# Patient Record
Sex: Female | Born: 2000 | Race: White | Hispanic: No | Marital: Single | Smoking: Never smoker
Health system: Southern US, Community
[De-identification: ages and names within clinical notes are randomized; demographics above are authoritative.]

## PROBLEM LIST (undated history)

## (undated) DIAGNOSIS — E282 Polycystic ovarian syndrome: Secondary | ICD-10-CM

## (undated) HISTORY — PX: TYMPANOSTOMY TUBE PLACEMENT: SHX32

## (undated) HISTORY — PX: TONSILLECTOMY AND ADENOIDECTOMY: SUR1326

## (undated) HISTORY — DX: Polycystic ovarian syndrome: E28.2

---
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
## 2021-01-16 ENCOUNTER — Other Ambulatory Visit: Payer: Self-pay

## 2021-01-16 ENCOUNTER — Emergency Department
Admission: EM | Admit: 2021-01-16 | Discharge: 2021-01-17 | Disposition: A | Discharge status: Home / Self Care | Payer: Medicaid Other | Attending: Emergency Medicine | Admitting: Emergency Medicine

## 2021-01-16 ENCOUNTER — Encounter: Payer: Self-pay | Admitting: Intensive Care

## 2021-01-16 DIAGNOSIS — R1030 Lower abdominal pain, unspecified: Secondary | ICD-10-CM | POA: Insufficient documentation

## 2021-01-16 DIAGNOSIS — R102 Pelvic and perineal pain: Secondary | ICD-10-CM

## 2021-01-16 LAB — COMPREHENSIVE METABOLIC PANEL
ALT: 19 U/L (ref 0–44)
AST: 18 U/L (ref 15–41)
Albumin: 4.2 g/dL (ref 3.5–5.0)
Alkaline Phosphatase: 61 U/L (ref 38–126)
Anion gap: 5 (ref 5–15)
BUN: <5 mg/dL — ABNORMAL LOW (ref 6–20)
CO2: 25 mmol/L (ref 22–32)
Calcium: 9.2 mg/dL (ref 8.9–10.3)
Chloride: 108 mmol/L (ref 98–111)
Creatinine, Ser: 0.6 mg/dL (ref 0.44–1.00)
GFR, Estimated: >60 mL/min (ref >60)
Glucose, Bld: 115 mg/dL — ABNORMAL HIGH (ref 70–99)
Potassium: 4.2 mmol/L (ref 3.5–5.1)
Sodium: 138 mmol/L (ref 135–145)
Total Bilirubin: 0.7 mg/dL (ref 0.3–1.2)
Total Protein: 7 g/dL (ref 6.5–8.1)

## 2021-01-16 LAB — CBC
HCT: 40.1 % (ref 36.0–46.0)
Hemoglobin: 13.1 g/dL (ref 12.0–15.0)
MCH: 30.5 pg (ref 26.0–34.0)
MCHC: 32.7 g/dL (ref 30.0–36.0)
MCV: 93.3 fL (ref 80.0–100.0)
Platelets: 221 10*3/uL (ref 150–400)
RBC: 4.3 MIL/uL (ref 3.87–5.11)
RDW: 13.1 % (ref 11.5–15.5)
WBC: 10.4 10*3/uL (ref 4.0–10.5)
nRBC: 0 % (ref 0.0–0.2)

## 2021-01-16 LAB — LIPASE, BLOOD: Lipase: 34 U/L (ref 11–51)

## 2021-01-16 LAB — URINALYSIS, COMPLETE (UACMP) WITH MICROSCOPIC
Bilirubin Urine: NEGATIVE
Glucose, UA: NEGATIVE mg/dL
Hgb urine dipstick: NEGATIVE
Ketones, ur: NEGATIVE mg/dL
Leukocytes,Ua: NEGATIVE
Nitrite: NEGATIVE
Protein, ur: NEGATIVE mg/dL
Specific Gravity, Urine: 1.017 (ref 1.005–1.030)
pH: 8 (ref 5.0–8.0)

## 2021-01-16 LAB — POC URINE PREG, ED: Preg Test, Ur: NEGATIVE

## 2021-01-16 MED ORDER — DROPERIDOL 2.5 MG/ML IJ SOLN
2.5000 mg | Freq: Once | INTRAMUSCULAR | Status: AC
  Administered 2021-01-16: 2.5 mg via INTRAMUSCULAR
  Filled 2021-01-16: qty 2

## 2021-01-16 NOTE — ED Provider Notes (Signed)
[facility] Emergency Department Provider Note ____________________________________________   Event Date/Time   First MD Initiated Contact with Patient 01/16/21 2256     (approximate)  I have reviewed the triage vital signs and the nursing notes.  HISTORY  Chief Complaint Abdominal Pain   HPI Michele Holmes is a 20 y.o. femalewho presents to the ED for evaluation of abd pain.   Chart review indicates hx obesity, PCOS.   Patient presents to the ED for evaluation of 5-6 days of lower abdominal/pelvic pain.  She reports the pain is central to her pelvis/suprapubic area, radiating bilaterally outwards.    She denies any additional symptoms alongside the pain, such as nausea, emesis, stool changes, vaginal discharge or bleeding, dysuria or hematuria.  She reports her LMP was about 1 month ago and is due for this week, she reports 1 day of typical vaginal bleeding 3 days ago that self resolved and has not continued.  She reports going to urgent care due to 3 days ago and was told that she probably has irritable bowel syndrome.   She reports that she is tolerating p.o. intake and toileting at her baseline.   Past Medical History:  Diagnosis Date   PCOS (polycystic ovarian syndrome)     Patient Active Problem List   Diagnosis Date Noted   PCOS (polycystic ovarian syndrome) 03/29/2018    Past Surgical History:  Procedure Laterality Date   TONSILLECTOMY AND ADENOIDECTOMY     TYMPANOSTOMY TUBE PLACEMENT      Prior to Admission medications   Medication Sig Start Date End Date Taking? Authorizing Provider  metFORMIN (GLUCOPHAGE) 1000 MG tablet TAKE 1 1/2 TAB BY MOUTH ONCE A DAY FOR 30 DAYS 02/15/18   [provider]    Allergies Patient has no known allergies.  History reviewed. No pertinent family history.  Social History Social History   Tobacco Use   Smoking status: Never   Smokeless tobacco: Never  Substance Use Topics   Alcohol  use: Not Currently   Drug use: Yes    Types: Marijuana   Review of Systems  Constitutional: No fever/chills Eyes: No visual changes. ENT: No sore throat. Cardiovascular: Denies chest pain. Respiratory: Denies shortness of breath. Gastrointestinal: Positive for abdominal/pelvic pain  No nausea, no vomiting.  No diarrhea.  No constipation. Genitourinary: Negative for dysuria. Musculoskeletal: Negative for back pain. Skin: Negative for rash. Neurological: Negative for headaches, focal weakness or numbness. ____________________________________________   PHYSICAL EXAM:  VITAL SIGNS: Vitals:   01/16/21 2330 01/17/21 0000  BP: 107/72 102/85  Pulse: 82 74  Resp:    Temp:    SpO2: 100% 100%    Constitutional: Alert and oriented. Well appearing and in no acute distress.  Sitting up in bed, playing on her phone. Eyes: Conjunctivae are normal. PERRL. EOMI. Head: Atraumatic. Nose: No congestion/rhinnorhea. Mouth/Throat: Mucous membranes are moist.  Oropharynx non-erythematous. Neck: No stridor. No cervical spine tenderness to palpation. Cardiovascular: Normal rate, regular rhythm. Grossly normal heart sounds.  Good peripheral circulation. Respiratory: Normal respiratory effort.  No retractions. Lungs CTAB. Gastrointestinal: Soft , nondistended. No CVA tenderness. Diffuse lower quadrant tenderness to palpation without localizing or peritoneal features.  Upper abdomen is benign. Musculoskeletal: No lower extremity tenderness nor edema.  No joint effusions. No signs of acute trauma. Neurologic:  Normal speech and language. No gross focal neurologic deficits are appreciated. No gait instability noted. Skin:  Skin is warm, dry and intact. No rash noted. Psychiatric: Mood and affect are  normal. Speech and behavior are normal.  ____________________________________________   LABS (all labs ordered are listed, but only abnormal results are displayed)  Labs Reviewed  COMPREHENSIVE  METABOLIC PANEL - Abnormal; Notable for the following components:      Result Value   Glucose, Bld 115 (*)    BUN <5 (*)    All other components within normal limits  URINALYSIS, COMPLETE (UACMP) WITH MICROSCOPIC - Abnormal; Notable for the following components:   Color, Urine YELLOW (*)    APPearance HAZY (*)    Bacteria, UA RARE (*)    All other components within normal limits  LIPASE, BLOOD  CBC  POC URINE PREG, ED   ____________________________________________  12 Lead EKG   ____________________________________________  RADIOLOGY  ED MD interpretation:    Official radiology report(s): US PELVIC COMPLETE W TRANSVAGINAL AND TORSION R/O  Result Date: 01/17/2021 CLINICAL DATA:  Pelvic pain, left greater than right, x5 days. PCOS. EXAM: TRANSABDOMINAL AND TRANSVAGINAL ULTRASOUND OF PELVIS DOPPLER ULTRASOUND OF OVARIES TECHNIQUE: Both transabdominal and transvaginal ultrasound examinations of the pelvis were performed. Transabdominal technique was performed for global imaging of the pelvis including uterus, ovaries, adnexal regions, and pelvic cul-de-sac. It was necessary to proceed with endovaginal exam following the transabdominal exam to visualize the endometrium. Color and duplex Doppler ultrasound was utilized to evaluate blood flow to the ovaries. COMPARISON:  None. FINDINGS: Uterus Measurements: 8.2 x 3.1 x 4.3 cm = volume: 56 mL. No fibroids or other mass visualized. Endometrium Thickness: 10 mm.  No focal abnormality visualized. Right ovary Measurements: 3.4 x 2.5 x 3.3 cm = volume: 14.5 mL. Multiple small follicles, including a dominant 1.9 cm follicle/corpus luteum. Left ovary Measurements: 3.4 x 1.9 x 2.2 cm = volume: 7.4 mL. Multiple small follicles. Pulsed Doppler evaluation of both ovaries demonstrates normal low-resistance arterial and venous waveforms. Other findings No abnormal free fluid. IMPRESSION: No evidence of ovarian torsion. Multiple small ovarian follicles,  compatible with the known clinical diagnosis of PCOS. Electronically Signed   By: Charline Bills M.D.   On: 01/17/2021 01:00    ____________________________________________   PROCEDURES and INTERVENTIONS  Procedure(s) performed (including Critical Care):  Procedures  Medications  diphenhydrAMINE (BENADRYL) capsule 25 mg (has no administration in time range)  droperidol (INAPSINE) 2.5 MG/ML injection 2.5 mg (2.5 mg Intramuscular Given 01/16/21 2352)    ____________________________________________   MDM / ED COURSE   Obese 20 year old woman without significant medical history presents to the ED with lower pelvic pain of uncertain etiology and amenable to outpatient management.  Normal vitals.  Exam initially with poorly localizing and mild lower abdominal tenderness to palpation without peritoneal features, and she otherwise looks well.  Blood work is reassuring without acute derangements.  Urine without infectious features and patient is not pregnant.  Due to her history of PCOS and localized pelvic pain, TVUS obtained and demonstrates no significant evidence of acute pathology such as torsion, large cysts, hemorrhagic cyst.  Considering her resolving symptoms with droperidol, continued p.o. toleration and benign work-up, we will discharge with return precautions and information to establish with a local PCP.  Return precautions were discussed.  Clinical Course as of 01/17/21 0134  Wed Jan 16, 2021  2324 Discussed with the patient and her sister reassuring blood and urinalysis.  We discussed attempts at symptom control and pelvic ultrasound diagnostically, but possibly not elucidating the etiology of her symptoms.  We discussed plan of care and she is in agreement. [DS]  Thu Jan 17, 2021  0133 Reassessed.  Patient reports improving symptoms after droperidol.  Repeat examination reveals benign exam. [DS]  0133 I discussed with patient, and sister who remains at the bedside, benign work-up  and no current indications for additional diagnostics.  We discussed management at home and we discussed return precautions for the ED. [DS]  0134 Patient expresses concern for pruritic area to her right antecubital fossa at the site of previous tape application, and is requesting Benadryl for this.  No evidence of anaphylaxis or hives. [DS]    Clinical Course User Index [DS] Delton Prairie, MD    ____________________________________________   FINAL CLINICAL IMPRESSION(S) / ED DIAGNOSES  Final diagnoses:  Pelvic pain     ED Discharge Orders     None        Sherina Stammer Katrinka Blazing   Note:  This document was prepared using Dragon voice recognition software and may include unintentional dictation errors.    Delton Prairie, MD 01/17/21 [phone]

## 2021-01-16 NOTE — ED Triage Notes (Signed)
Patient c/o central pelvic pain and radiates to right and left side X5 days. Reports pain is worse with movement. States a few days before this pain started she was having emesis.

## 2021-01-17 ENCOUNTER — Emergency Department: Payer: Medicaid Other

## 2021-01-17 ENCOUNTER — Other Ambulatory Visit: Payer: Medicaid Other

## 2021-01-17 MED ORDER — DIPHENHYDRAMINE HCL 25 MG PO CAPS
25.0000 mg | ORAL_CAPSULE | Freq: Once | ORAL | Status: DC
  Filled 2021-01-17: qty 1

## 2021-01-17 NOTE — ED Notes (Signed)
This RN went to discharge pt and pt walked out .

## 2021-01-17 NOTE — Discharge Instructions (Addendum)
Use Tylenol for pain and fevers.  Up to 1000 mg per dose, up to 4 times per day.  Do not take more than 4000 mg of Tylenol/acetaminophen within 24 hours..  Use naproxen/Aleve for anti-inflammatory pain relief. Use up to 500mg  every 12 hours. Do not take more frequently than this. Do not use other NSAIDs (ibuprofen, Advil) while taking this medication. It is safe to take Tylenol with this.   If you develop any fevers with your symptoms, uncontrolled pain despite the above medications, recurrently throwing up or diarrhea with your pain, please return to the ED.

## 2022-04-21 IMAGING — US US PELVIS COMPLETE TRANSABD/TRANSVAG W DUPLEX
1 series · 13 of 25 positions shown · non-contrast
Comparison: None.

CLINICAL DATA: Pelvic pain, left greater than right, x5 days. PCOS.

EXAM:
TRANSABDOMINAL AND TRANSVAGINAL ULTRASOUND OF PELVIS
DOPPLER ULTRASOUND OF OVARIES
TECHNIQUE: Both transabdominal and transvaginal ultrasound examinations of the
pelvis were performed. Transabdominal technique was performed for
global imaging of the pelvis including uterus, ovaries, adnexal
regions, and pelvic cul-de-sac.
It was necessary to proceed with endovaginal exam following the
transabdominal exam to visualize the endometrium. Color and duplex
Doppler ultrasound was utilized to evaluate blood flow to the
ovaries.

[Series 1: us pelvic complete w transvaginal and torsion righ · 13 of 132 slices shown]
[im 1/132]
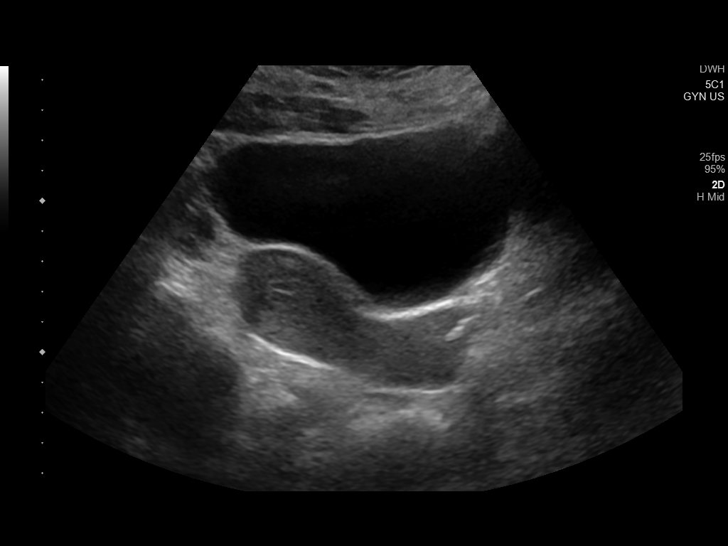
[im 11/132]
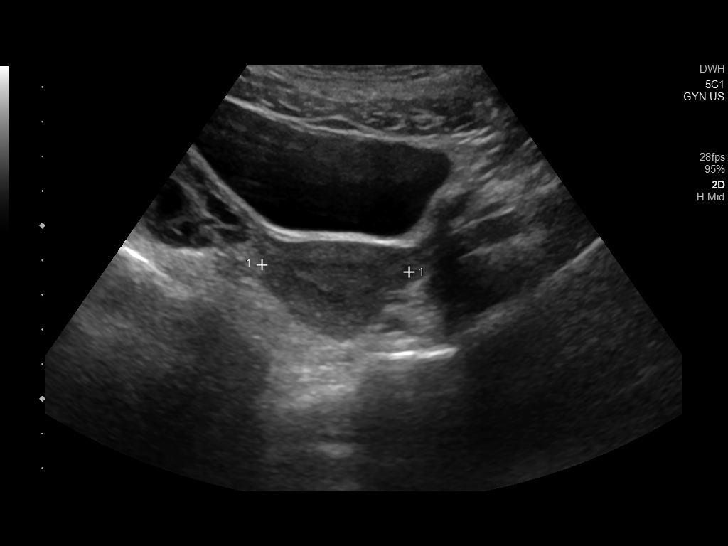
[im 22/132]
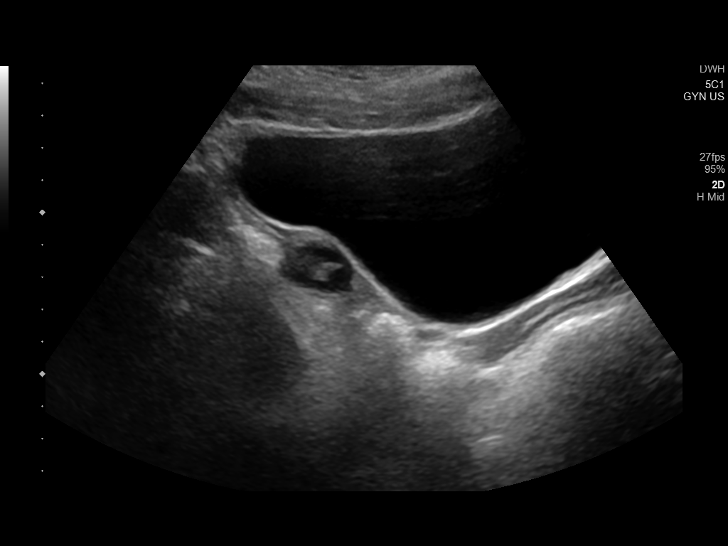
[im 33/132]
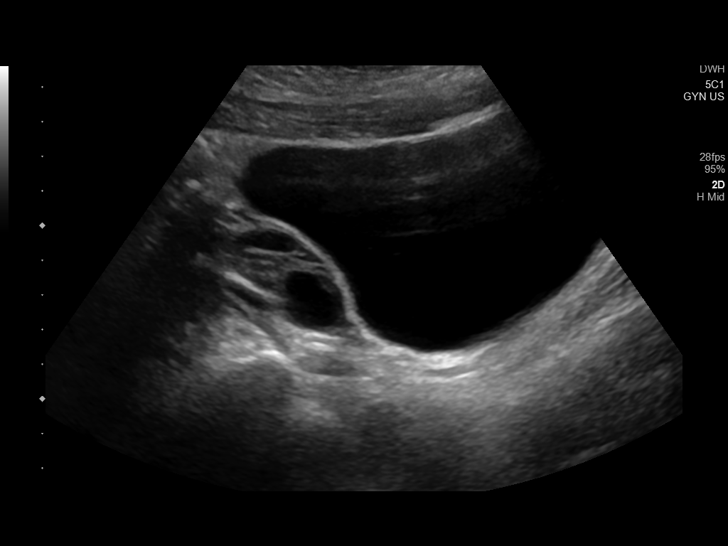
[im 44/132]
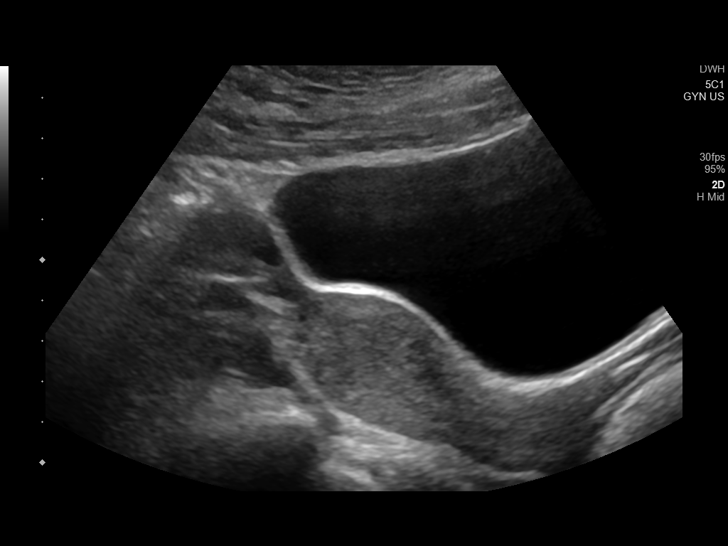
[im 55/132]
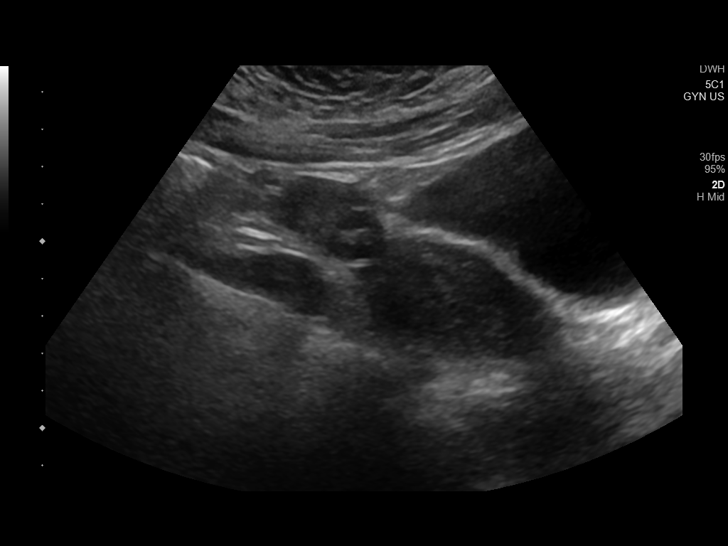
[im 66/132]
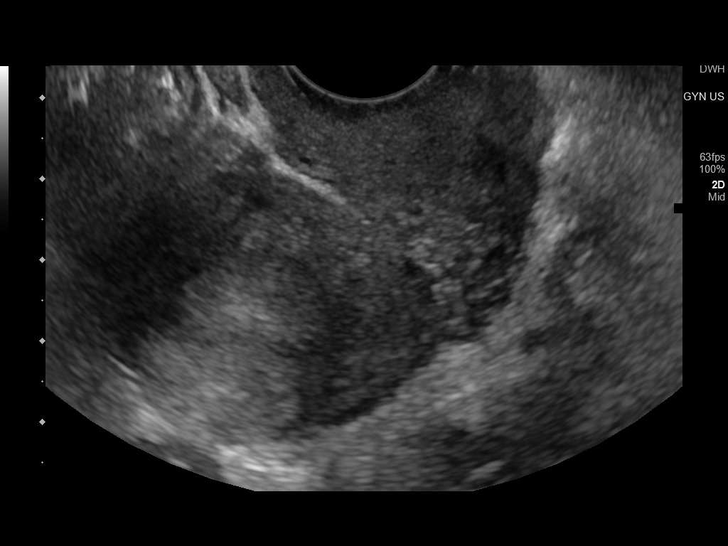
[im 77/132]
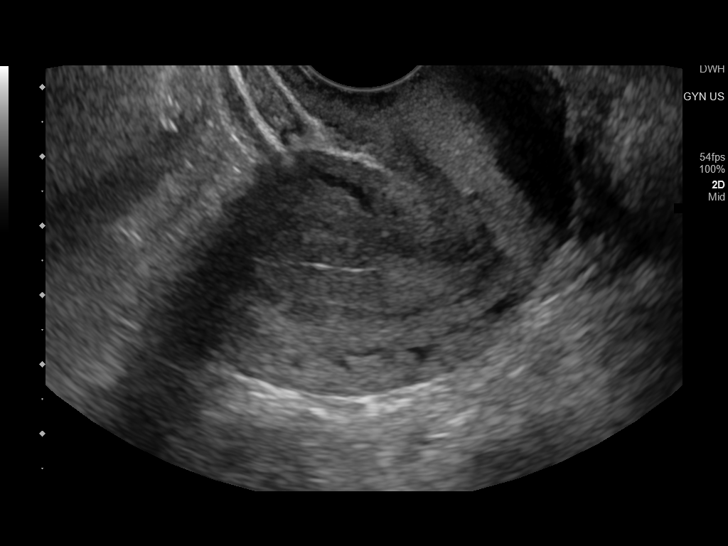
[im 88/132]
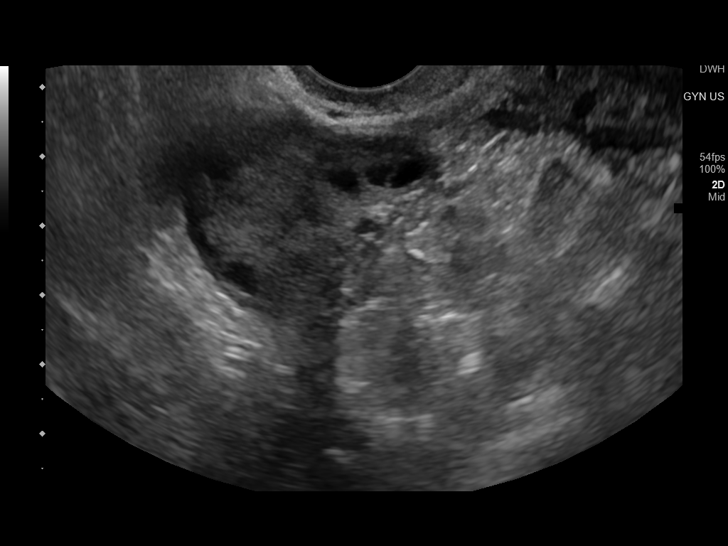
[im 99/132]
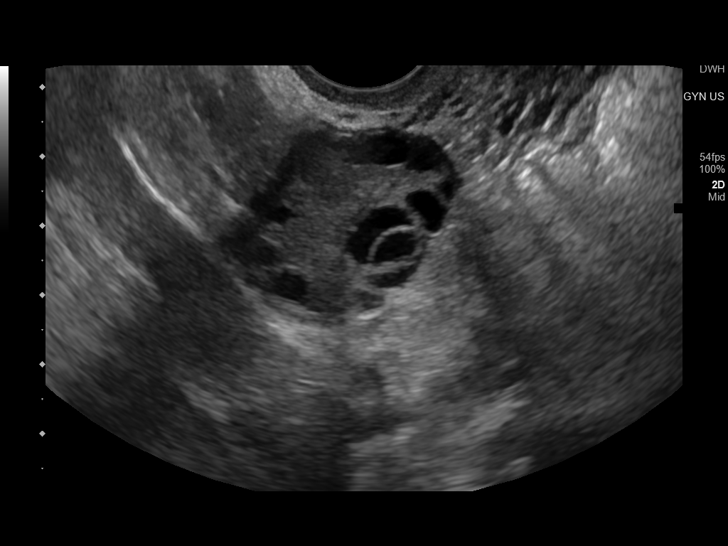
[im 110/132]
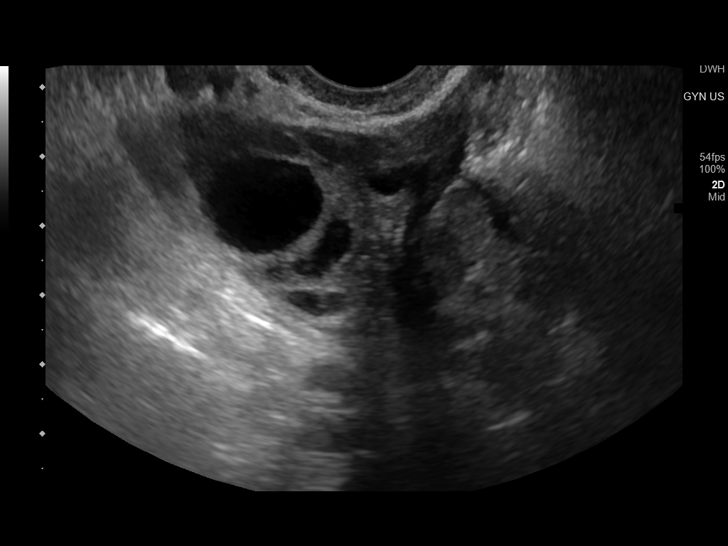
[im 121/132]
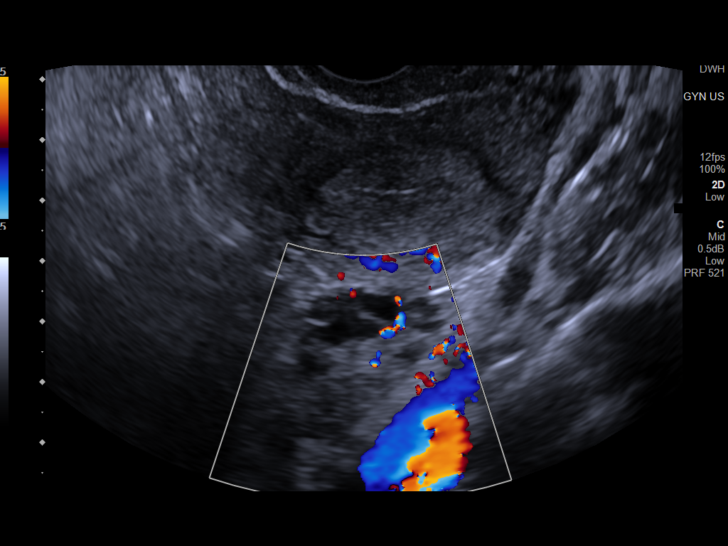
[im 132/132]
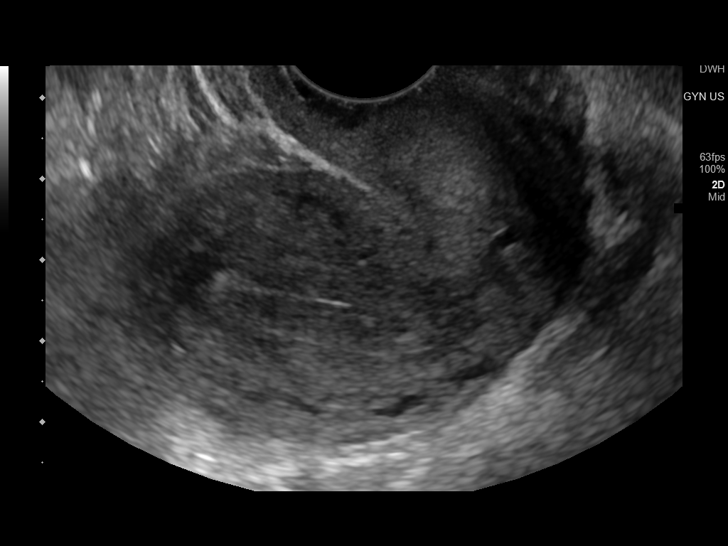

[13 of 25 positions shown; findings below may reference images not displayed]

FINDINGS: Uterus

Measurements: 8.2 x 3.1 x 4.3 cm = volume: 56 mL. No fibroids or
other mass visualized.

Endometrium

Thickness: 10 mm.  No focal abnormality visualized.

Right ovary

Measurements: 3.4 x 2.5 x 3.3 cm = volume: 14.5 mL. Multiple small
follicles, including a dominant 1.9 cm follicle/corpus luteum.

Left ovary

Measurements: 3.4 x 1.9 x 2.2 cm = volume: 7.4 mL. Multiple small
follicles.

Pulsed Doppler evaluation of both ovaries demonstrates normal
low-resistance arterial and venous waveforms.

Other findings

No abnormal free fluid.
IMPRESSION: No evidence of ovarian torsion.

Multiple small ovarian follicles, compatible with the known clinical
diagnosis of PCOS.

## 2024-01-26 ENCOUNTER — Ambulatory Visit (HOSPITAL_COMMUNITY): Admitting: Psychiatry
